# Patient Record
Sex: Male | Born: 1980 | Race: White | Hispanic: No | Marital: Married | Smoking: Current every day smoker
Health system: Southern US, Community
[De-identification: ages and names within clinical notes are randomized; demographics above are authoritative.]

## PROBLEM LIST (undated history)

## (undated) DIAGNOSIS — J45909 Unspecified asthma, uncomplicated: Secondary | ICD-10-CM

## (undated) HISTORY — PX: WISDOM TOOTH EXTRACTION: SHX21

---

## 2014-12-06 ENCOUNTER — Emergency Department (HOSPITAL_COMMUNITY)
Admission: EM | Admit: 2014-12-06 | Discharge: 2014-12-07 | Disposition: A | Discharge status: Home / Self Care | Payer: 59 | Attending: Emergency Medicine | Admitting: Emergency Medicine

## 2014-12-06 ENCOUNTER — Encounter (HOSPITAL_COMMUNITY): Payer: Self-pay

## 2014-12-06 DIAGNOSIS — Z720 Tobacco use: Secondary | ICD-10-CM | POA: Insufficient documentation

## 2014-12-06 DIAGNOSIS — R599 Enlarged lymph nodes, unspecified: Secondary | ICD-10-CM | POA: Diagnosis not present

## 2014-12-06 DIAGNOSIS — J45909 Unspecified asthma, uncomplicated: Secondary | ICD-10-CM | POA: Insufficient documentation

## 2014-12-06 DIAGNOSIS — Z23 Encounter for immunization: Secondary | ICD-10-CM | POA: Insufficient documentation

## 2014-12-06 DIAGNOSIS — L0201 Cutaneous abscess of face: Secondary | ICD-10-CM

## 2014-12-06 HISTORY — DX: Unspecified asthma, uncomplicated: J45.909

## 2014-12-06 LAB — I-STAT CREATININE, ED: Creatinine, Ser: 1.3 mg/dL — ABNORMAL HIGH (ref 0.61–1.24)

## 2014-12-06 MED ORDER — KETOROLAC TROMETHAMINE 30 MG/ML IJ SOLN
30.0000 mg | Freq: Once | INTRAMUSCULAR | Status: AC
  Administered 2014-12-06: 30 mg via INTRAVENOUS
  Filled 2014-12-06: qty 1

## 2014-12-06 MED ORDER — TETANUS-DIPHTH-ACELL PERTUSSIS 5-2.5-18.5 LF-MCG/0.5 IM SUSP
0.5000 mL | Freq: Once | INTRAMUSCULAR | Status: AC
  Administered 2014-12-06: 0.5 mL via INTRAMUSCULAR
  Filled 2014-12-06: qty 0.5

## 2014-12-06 MED ORDER — ONDANSETRON HCL 4 MG/2ML IJ SOLN
4.0000 mg | Freq: Once | INTRAMUSCULAR | Status: AC
  Administered 2014-12-06: 4 mg via INTRAVENOUS
  Filled 2014-12-06: qty 2

## 2014-12-06 MED ORDER — DOXYCYCLINE HYCLATE 100 MG PO CAPS: 100.0000 mg | ORAL_CAPSULE | Freq: Two times a day (BID) | ORAL | Status: AC

## 2014-12-06 MED ORDER — DOXYCYCLINE HYCLATE 100 MG PO TABS
100.0000 mg | ORAL_TABLET | Freq: Once | ORAL | Status: AC
  Administered 2014-12-07: 100 mg via ORAL
  Filled 2014-12-06: qty 1

## 2014-12-06 MED ORDER — LIDOCAINE-EPINEPHRINE (PF) 2 %-1:200000 IJ SOLN
10.0000 mL | Freq: Once | INTRAMUSCULAR | Status: AC
  Administered 2014-12-06: 10 mL via INTRADERMAL
  Filled 2014-12-06: qty 20

## 2014-12-06 MED ORDER — HYDROCODONE-ACETAMINOPHEN 5-325 MG PO TABS: 1.0000 | ORAL_TABLET | Freq: Four times a day (QID) | ORAL | Status: AC | PRN

## 2014-12-06 NOTE — ED Notes (Signed)
Pt presents with abscess to lower left chin that he noticed today with occasional purulent drainage but no drainage currently. Denies vomiting/chills.

## 2014-12-06 NOTE — ED Notes (Signed)
Attempted access x2, unsuccessful. Second RN to attempt

## 2014-12-06 NOTE — ED Notes (Signed)
PA at bedside.

## 2014-12-06 NOTE — ED Provider Notes (Signed)
CSN: [id number]     Arrival date & time 12/06/14  2130 History   First MD Initiated Contact with Patient 12/06/14 2213     Chief Complaint  Patient presents with  . Facial Swelling  . Abscess     (Consider location/radiation/quality/duration/timing/severity/associated sxs/prior Treatment) HPI   34 year old male who presents for evaluation of facial infection. Patient notice a boil to his left chin last night that has been draining small amount of pus. Report sharp, moderate pain, worse with palpation.  No fever, chills. He did attempt to poke it with a needle and expressed some pus.  Does not recall last tetanus status.  Denies dental pain.  No neck pain, or sore throat.  No recent injury.    Past Medical History  Diagnosis Date  . Asthma    Past Surgical History  Procedure Laterality Date  . Wisdom tooth extraction     No family history on file. Social History  Substance Use Topics  . Smoking status: Current Every Day Smoker  . Smokeless tobacco: None  . Alcohol Use: Yes    Review of Systems  Constitutional: Negative for fever.  HENT: Positive for facial swelling.   Musculoskeletal: Negative for neck pain.  Skin: Positive for rash.  Neurological: Negative for numbness.      Allergies  Mucinex  Home Medications   Prior to Admission medications   Not on File   BP 156/90 mmHg  Pulse 78  Temp(Src) 99 F (37.2 C) (Oral)  Resp 18  Ht  (1.803 m)  Wt 250 lb (113.399 kg)  BMI 34.88 kg/m2  SpO2 99% Physical Exam  Constitutional: He appears well-developed and well-nourished. No distress.  HENT:  Head: Atraumatic.  Face: an area of induration and fluctuance that is TTP noted to L anterior chin tracking down to inferior chin and neck.  No surround skin erythema.  No dental pain.    Eyes: Conjunctivae are normal.  Neck: Neck supple.  No nuchal rigidity  Lymphadenopathy:    He has cervical adenopathy.  Neurological: He is alert.  Psychiatric: He has a  normal mood and affect.  Nursing note and vitals reviewed.   ED Course  Procedures (including critical care time)  Pt with facial cutaneous abscess amenable for I&D.  tdap given.    11:00 PM I&D performed by myself and PA student Harlin Heys, without pustular drainage.  Will obtain maxillofacial CT scan for further evaluation.  Care discussed with PA Ivar Drape who will continue with pt care.    INCISION AND DRAINAGE Performed by: Fayrene Helper Consent: Verbal consent obtained. Risks and benefits: risks, benefits and alternatives were discussed Type: abscess  Body area: L anterior chin  Anesthesia: local infiltration  Incision was made with a scalpel.  Local anesthetic: lidocaine 2% w epinephrine  Anesthetic total: 6 ml  Complexity: complex Blunt dissection to break up loculations  Drainage: sanguianous  Drainage amount: small  Packing material: 1/4 in iodoform gauze  Patient tolerance: Patient tolerated the procedure well with no immediate complications.  11:09 PM Care discussed with Dr. Patria Mane who agrees to monitor pt and f/u on CT scan and will determine further management.  Pt aware of plan.     Labs Review Labs Reviewed - No data to display  Imaging Review No results found. I have personally reviewed and evaluated these images and lab results as part of my medical decision-making.   EKG Interpretation None      MDM   Final diagnoses:  Cutaneous abscess of face    BP 156/90 mmHg  Pulse 78  Temp(Src) 99 F (37.2 C) (Oral)  Resp 18  Ht  (1.803 m)  Wt 250 lb (113.399 kg)  BMI 34.88 kg/m2  SpO2 99%     Fayrene Helper, PA-C 12/07/14 1259  Azalia Bilis, MD 12/08/14 [phone]

## 2014-12-07 ENCOUNTER — Encounter (HOSPITAL_COMMUNITY): Payer: Self-pay | Admitting: Radiology

## 2014-12-07 ENCOUNTER — Emergency Department (HOSPITAL_COMMUNITY): Payer: 59

## 2014-12-07 MED ORDER — IOHEXOL 300 MG/ML  SOLN
75.0000 mL | Freq: Once | INTRAMUSCULAR | Status: AC | PRN
  Administered 2014-12-07: 75 mL via INTRAVENOUS

## 2014-12-07 MED ORDER — PENICILLIN V POTASSIUM 250 MG PO TABS: 250.0000 mg | ORAL_TABLET | Freq: Four times a day (QID) | ORAL | Status: DC

## 2014-12-07 NOTE — Discharge Instructions (Signed)
Apply warm compress to affected area several times daily.  Follow up with your dentist in 2 days. Take antibiotic and pain medication as prescribed.  Return if your condition worsen or if you have other concerns.  Abscess Care After An abscess (also called a boil or furuncle) is an infected area that contains a collection of pus. Signs and symptoms of an abscess include pain, tenderness, redness, or hardness, or you may feel a moveable soft area under your skin. An abscess can occur anywhere in the body. The infection may spread to surrounding tissues causing cellulitis. A cut (incision) by the surgeon was made over your abscess and the pus was drained out. Gauze may have been packed into the space to provide a drain that will allow the cavity to heal from the inside outwards. The boil may be painful for 5 to 7 days. Most people with a boil do not have high fevers. Your abscess, if seen early, may not have localized, and may not have been lanced. If not, another appointment may be required for this if it does not get better on its own or with medications. HOME CARE INSTRUCTIONS   Only take over-the-counter or prescription medicines for pain, discomfort, or fever as directed by your caregiver.  When you bathe, soak and then remove gauze or iodoform packs at least daily or as directed by your caregiver. You may then wash the wound gently with mild soapy water. Repack with gauze or do as your caregiver directs. SEEK IMMEDIATE MEDICAL CARE IF:   You develop increased pain, swelling, redness, drainage, or bleeding in the wound site.  You develop signs of generalized infection including muscle aches, chills, fever, or a general ill feeling.  An oral temperature above 102 F (38.9 C) develops, not controlled by medication. See your caregiver for a recheck if you develop any of the symptoms described above. If medications (antibiotics) were prescribed, take them as directed. Document Released: 09/16/2004  Document Revised: 05/23/2011 Document Reviewed: 05/14/2007 [facility] Patient Information 2015 [place], [place]. This information is not intended to replace advice given to you by your health care provider. Make sure you discuss any questions you have with your health care provider. Dental Abscess A dental abscess is a collection of infected fluid (pus) from a bacterial infection in the inner part of the tooth (pulp). It usually occurs at the end of the tooth's root.  CAUSES   Severe tooth decay.  Trauma to the tooth that allows bacteria to enter into the pulp, such as a broken or chipped tooth. SYMPTOMS   Severe pain in and around the infected tooth.  Swelling and redness around the abscessed tooth or in the mouth or face.  Tenderness.  Pus drainage.  Bad breath.  Bitter taste in the mouth.  Difficulty swallowing.  Difficulty opening the mouth.  Nausea.  Vomiting.  Chills.  Swollen neck glands. DIAGNOSIS   A medical and dental history will be taken.  An examination will be performed by tapping on the abscessed tooth.  X-rays may be taken of the tooth to identify the abscess. TREATMENT The goal of treatment is to eliminate the infection. You may be prescribed antibiotic medicine to stop the infection from spreading. A root canal may be performed to save the tooth. If the tooth cannot be saved, it may be pulled (extracted) and the abscess may be drained.  HOME CARE INSTRUCTIONS  Only take over-the-counter or prescription medicines for pain, fever, or discomfort as directed by your caregiver.  Rinse your mouth (gargle) often with salt water ( tsp salt in 8 oz [250 ml] of warm water) to relieve pain or swelling.  Do not drive after taking pain medicine (narcotics).  Do not apply heat to the outside of your face.  Return to your dentist for further treatment as directed. SEEK MEDICAL CARE IF:  Your pain is not helped by medicine.  Your pain is getting worse instead  of better. SEEK IMMEDIATE MEDICAL CARE IF:  You have a fever or persistent symptoms for more than 2-3 days.  You have a fever and your symptoms suddenly get worse.  You have chills or a very bad headache.  You have problems breathing or swallowing.  You have trouble opening your mouth.  You have swelling in the neck or around the eye. Document Released: 02/28/2005 Document Revised: 11/23/2011 Document Reviewed: 06/08/2010 [facility] Patient Information 2015 [place], [place]. This information is not intended to replace advice given to you by your health care provider. Make sure you discuss any questions you have with your health care provider.

## 2014-12-07 NOTE — ED Notes (Signed)
Patient left at this time with all belongings. 

## 2014-12-10 ENCOUNTER — Encounter (HOSPITAL_COMMUNITY): Payer: Self-pay | Admitting: *Deleted

## 2014-12-10 ENCOUNTER — Emergency Department (HOSPITAL_COMMUNITY)
Admission: EM | Admit: 2014-12-10 | Discharge: 2014-12-10 | Disposition: A | Discharge status: Home / Self Care | Payer: 59 | Attending: Emergency Medicine | Admitting: Emergency Medicine

## 2014-12-10 DIAGNOSIS — Z79899 Other long term (current) drug therapy: Secondary | ICD-10-CM | POA: Insufficient documentation

## 2014-12-10 DIAGNOSIS — L03211 Cellulitis of face: Secondary | ICD-10-CM | POA: Diagnosis not present

## 2014-12-10 DIAGNOSIS — L0201 Cutaneous abscess of face: Secondary | ICD-10-CM | POA: Diagnosis present

## 2014-12-10 DIAGNOSIS — Z720 Tobacco use: Secondary | ICD-10-CM | POA: Diagnosis not present

## 2014-12-10 LAB — BASIC METABOLIC PANEL
Anion gap: 6 (ref 5–15)
BUN: 13 mg/dL (ref 6–20)
CO2: 27 mmol/L (ref 22–32)
CREATININE: 1.03 mg/dL (ref 0.61–1.24)
Calcium: 9 mg/dL (ref 8.9–10.3)
Chloride: 108 mmol/L (ref 101–111)
GFR calc Af Amer: >60 mL/min (ref >60)
Glucose, Bld: 96 mg/dL (ref 65–99)
Potassium: 4.5 mmol/L (ref 3.5–5.1)
SODIUM: 141 mmol/L (ref 135–145)

## 2014-12-10 LAB — CBC WITH DIFFERENTIAL/PLATELET
BASOS ABS: 0.1 10*3/uL (ref 0.0–0.1)
Basophils Relative: 1 %
EOS ABS: 0.5 10*3/uL (ref 0.0–0.7)
EOS PCT: 6 %
HCT: 44.7 % (ref 39.0–52.0)
Hemoglobin: 15 g/dL (ref 13.0–17.0)
Lymphocytes Relative: 23 %
Lymphs Abs: 1.8 10*3/uL (ref 0.7–4.0)
MCH: 30.3 pg (ref 26.0–34.0)
MCHC: 33.6 g/dL (ref 30.0–36.0)
MCV: 90.3 fL (ref 78.0–100.0)
Monocytes Absolute: 0.5 10*3/uL (ref 0.1–1.0)
Monocytes Relative: 6 %
Neutro Abs: 5 10*3/uL (ref 1.7–7.7)
Neutrophils Relative %: 64 %
PLATELETS: 329 10*3/uL (ref 150–400)
RBC: 4.95 MIL/uL (ref 4.22–5.81)
RDW: 13.9 % (ref 11.5–15.5)
WBC: 7.8 10*3/uL (ref 4.0–10.5)

## 2014-12-10 LAB — I-STAT CG4 LACTIC ACID, ED: LACTIC ACID, VENOUS: 0.79 mmol/L (ref 0.5–2.0)

## 2014-12-10 MED ORDER — SULFAMETHOXAZOLE-TRIMETHOPRIM 800-160 MG PO TABS
1.0000 | ORAL_TABLET | Freq: Once | ORAL | Status: AC
  Administered 2014-12-10: 1 via ORAL
  Filled 2014-12-10: qty 1

## 2014-12-10 MED ORDER — SULFAMETHOXAZOLE-TRIMETHOPRIM 800-160 MG PO TABS: 1.0000 | ORAL_TABLET | Freq: Two times a day (BID) | ORAL | Status: AC

## 2014-12-10 NOTE — Discharge Instructions (Signed)
As discussed, facial cellulitis can take up to one week to resolve. Please take all medication as directed, use warm compresses, 4 times daily, and monitor your condition carefully. Return here for concerning changes in your condition.  If you develop concerning changes, but no difficult to breathing, no fever, you may also choose to follow-up with our ENT specialist.   Cellulitis Cellulitis is an infection of the skin and the tissue under the skin. The infected area is usually red and tender. This happens most often in the arms and lower legs. HOME CARE   Take your antibiotic medicine as told. Finish the medicine even if you start to feel better.  Keep the infected arm or leg raised (elevated).  Put a warm cloth on the area up to 4 times per day.  Only take medicines as told by your doctor.  Keep all doctor visits as told. GET HELP IF:  You see red streaks on the skin coming from the infected area.  Your red area gets bigger or turns a dark color.  Your bone or joint under the infected area is painful after the skin heals.  Your infection comes back in the same area or different area.  You have a puffy (swollen) bump in the infected area.  You have new symptoms.  You have a fever. GET HELP RIGHT AWAY IF:   You feel very sleepy.  You throw up (vomit) or have watery poop (diarrhea).  You feel sick and have muscle aches and pains. MAKE SURE YOU:   Understand these instructions.  Will watch your condition.  Will get help right away if you are not doing well or get worse. Document Released: 08/17/2007 Document Revised: 07/15/2013 Document Reviewed: 05/16/2011 [facility] Patient Information 2015 [place], [place]. This information is not intended to replace advice given to you by your health care provider. Make sure you discuss any questions you have with your health care provider.

## 2014-12-10 NOTE — ED Notes (Signed)
Pt reports being seen on 9/24 for dental abscess, started on antibiotics and no relief, having increase in swelling down his neck. Airway intact.

## 2014-12-10 NOTE — ED Provider Notes (Addendum)
CSN: [id number]     Arrival date & time 12/10/14  1040 History   First MD Initiated Contact with Patient 12/10/14 1234     Chief Complaint  Patient presents with  . Abscess     (Consider location/radiation/quality/duration/timing/severity/associated sxs/prior Treatment) HPI Patient presents with concern of facial wound. Wound began last week, since onset he has been seen by another practitioner, started on antibiotics. He notes that in spite of initiating antibiotics, he continues to have large erythematous lesion on the left inferior lateral face. No new difficulty swallowing, breathing, speaking. No new fever, chills, vomiting, diarrhea. There is some discomfort diffusely throughout the lesion, unchanged with OTC medication. Past Medical History  Diagnosis Date  . Asthma    Past Surgical History  Procedure Laterality Date  . Wisdom tooth extraction     History reviewed. No pertinent family history. Social History  Substance Use Topics  . Smoking status: Current Every Day Smoker  . Smokeless tobacco: None  . Alcohol Use: Yes    Review of Systems  Constitutional:       Per HPI, otherwise negative  HENT:       Per HPI, otherwise negative  Respiratory:       Per HPI, otherwise negative  Cardiovascular:       Per HPI, otherwise negative  Gastrointestinal: Negative for nausea and vomiting.  Endocrine:       Negative aside from HPI  Genitourinary:       Neg aside from HPI   Musculoskeletal:       Per HPI, otherwise negative  Skin: Positive for color change and wound.  Neurological: Negative for syncope, weakness and headaches.      Allergies  Mucinex  Home Medications   Prior to Admission medications   Medication Sig Start Date End Date Taking? Authorizing Provider  doxycycline (VIBRAMYCIN) 100 MG capsule Take 1 capsule (100 mg total) by mouth 2 (two) times daily. One po bid x 7 days 12/06/14  Yes Fayrene Helper, PA-C  esomeprazole (NEXIUM) 40 MG capsule Take 40  mg by mouth daily at 12 noon.   Yes Historical Provider, MD  HYDROcodone-acetaminophen (NORCO/VICODIN) 5-325 MG per tablet Take 1 tablet by mouth every 6 (six) hours as needed for moderate pain or severe pain. 12/06/14  Yes Fayrene Helper, PA-C  penicillin v potassium (VEETID) 250 MG tablet Take 1 tablet (250 mg total) by mouth 4 (four) times daily. 12/07/14 12/14/14 Yes Roxy Horseman, PA-C   BP 129/96 mmHg  Pulse 95  Temp(Src) 99.4 F (37.4 C) (Oral)  Resp 17  Ht  (1.803 m)  Wt 250 lb (113.399 kg)  BMI 34.88 kg/m2  SpO2 97% Physical Exam  Constitutional: He is oriented to person, place, and time. He appears well-developed and well-nourished. No distress.  HENT:  Head: Normocephalic and atraumatic.  Mouth/Throat: Uvula is midline, oropharynx is clear and moist and mucous membranes are normal.  Face: an area of induration and fluctuance that is TTP noted to L anterior chin tracking down to inferior chin and neck.  No surround skin erythema.  The area of induration is from the left lateral chin tracking inferiorly, approximately 10 cm total diameter  Eyes: Conjunctivae and EOM are normal.  Neck: Neck supple.  No nuchal rigidity  Cardiovascular: Normal rate and regular rhythm.   Pulmonary/Chest: Effort normal. No stridor. No respiratory distress.  Abdominal: He exhibits no distension.  Musculoskeletal: He exhibits no edema.  Lymphadenopathy:    He has cervical adenopathy.  Neurological: He is alert and oriented to person, place, and time.  Skin: Skin is warm and dry.  Psychiatric: He has a normal mood and affect.  Nursing note and vitals reviewed.   ED Course  Procedures (including critical care time) Labs Review Labs Reviewed  BASIC METABOLIC PANEL  CBC WITH DIFFERENTIAL/PLATELET  I-STAT CG4 LACTIC ACID, ED    I reviewed the patient's chart, including visit here several days ago, with CT scan, ultrasound, concerning for facial cellulitis, no evidence for abscess.  On  repeat exam, patient has no new complaints Discussed all findings, he for close monitoring, burning in his antibiotics coverage to include MRSA. Patient will follow up with ENT as needed. MDM  Patient presents with concern of wound progression after recent diagnosis of facial cellulitis. Here, patient does have indurated area on his left anterior face, but no evidence for oral pharyngeal spread, nor evidence for bacteremia or sepsis. Patient's labs are reassuring, vital signs reassuring. Patient did have slight change in antibiotics, but was appropriate for further monitoring, management at home.    Gerhard Munch, MD 12/10/14 1415  Gerhard Munch, MD 01/22/15 [phone]

## 2017-05-15 IMAGING — CT CT MAXILLOFACIAL W/ CM
3 series · 15 of 47 positions shown, 18 images · IV contrast (omnipaque)
Comparison: None.

CLINICAL DATA: Left facial swelling. Left chin abscess since
yesterday. The patient drained it himself.

EXAM:
CT MAXILLOFACIAL WITH CONTRAST
TECHNIQUE: Multidetector CT imaging of the maxillofacial structures was
performed with intravenous contrast. Multiplanar CT image
reconstructions were also generated. A small metallic BB was placed
on the right temple in order to reliably differentiate right from
left.
CONTRAST:  75mL OMNIPAQUE IOHEXOL 300 MG/ML  SOLN

[Series 3: facial/ orbits 2.0 h30s · axial · 0.37mm/px · z∈[-198,-16]mm · 9 of 107 slices shown, 12 images]
[im 8/107  brain]
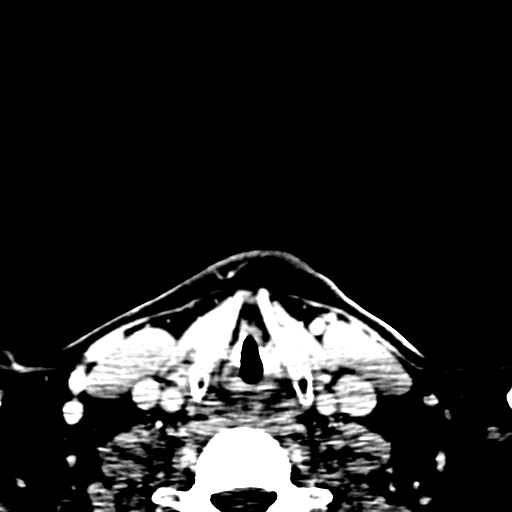
[im 8/107  bone]
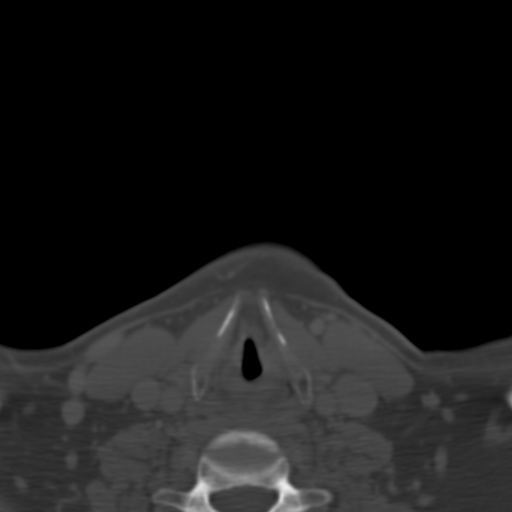
[im 19/107  bone]
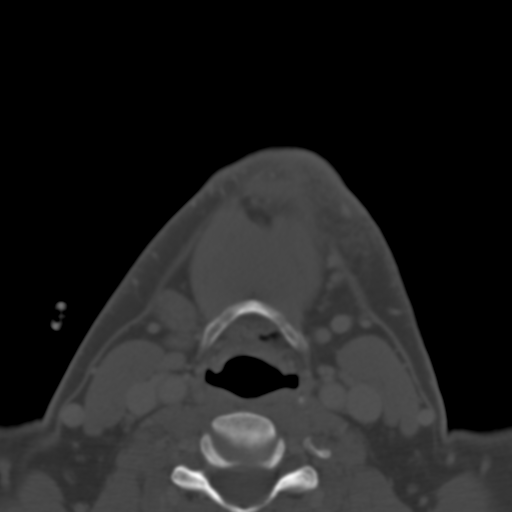
[im 30/107  bone]
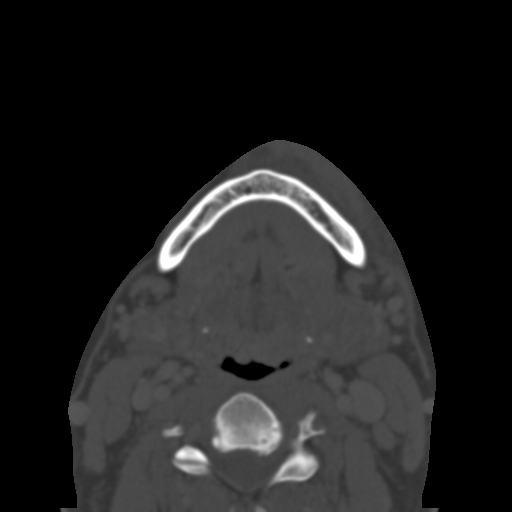
[im 41/107  bone]
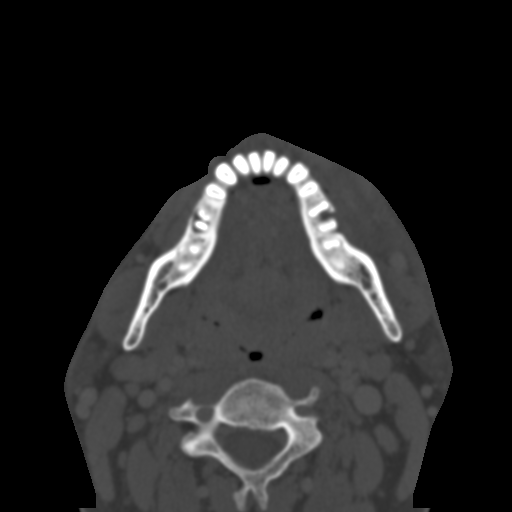
[im 55/107  brain]
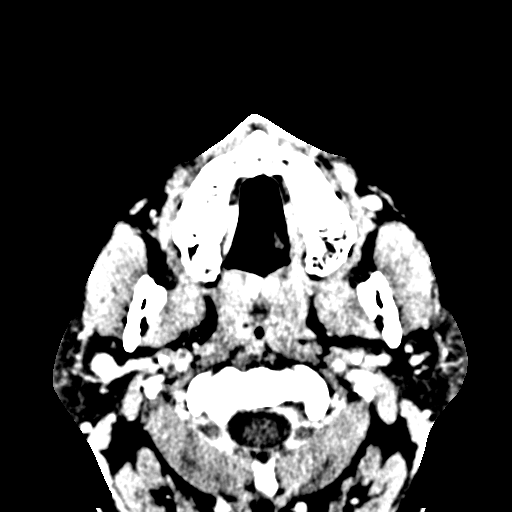
[im 55/107  bone]
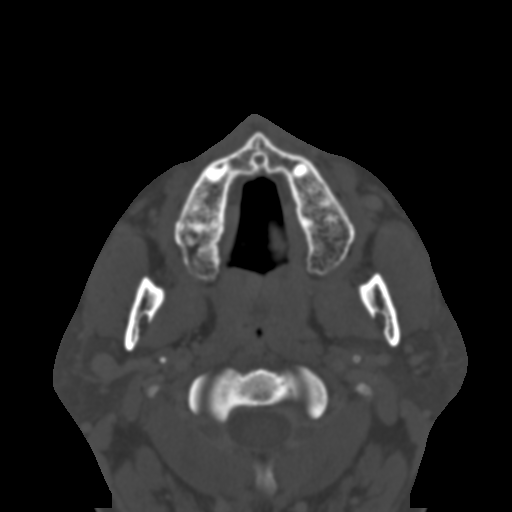
[im 66/107  bone]
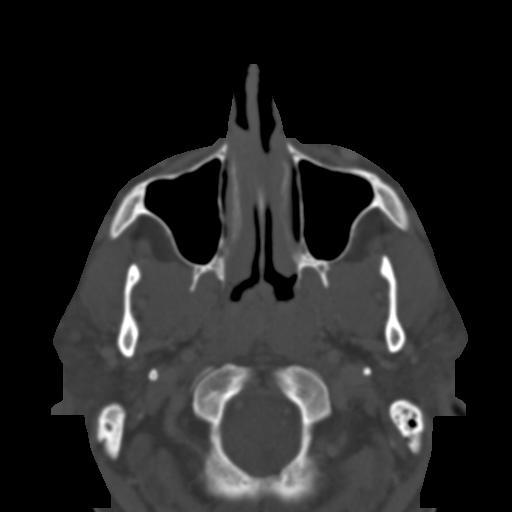
[im 77/107  bone]
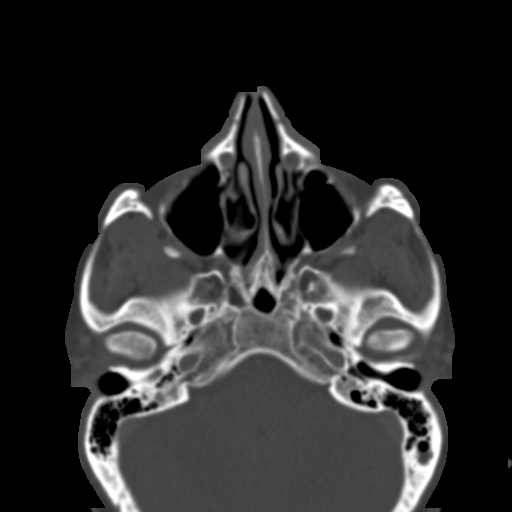
[im 88/107  bone]
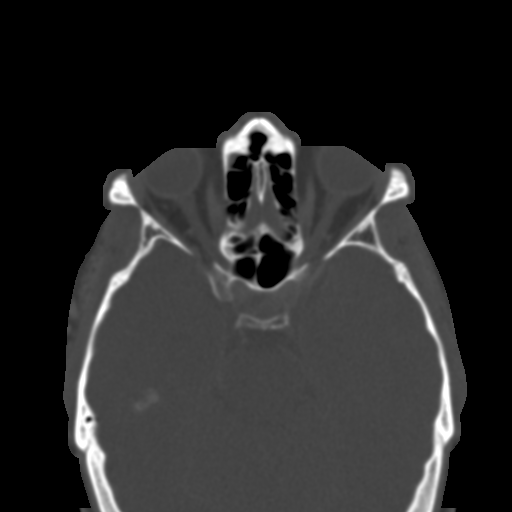
[im 99/107  brain]
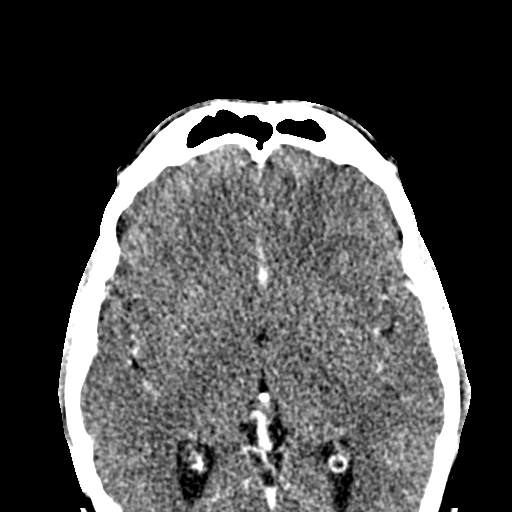
[im 99/107  bone]
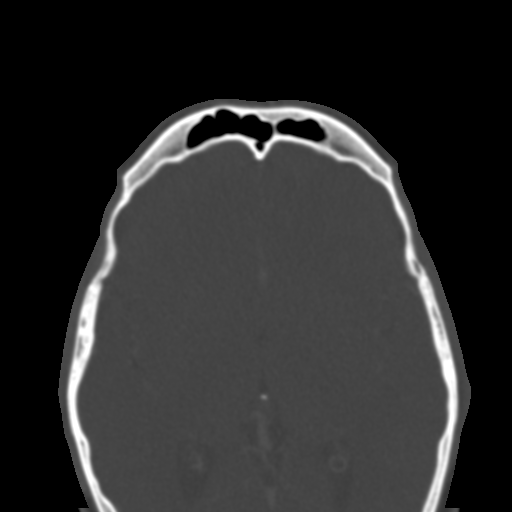

[Series 7: coronal soft tissue · coronal · 0.38mm/px · 3 of 88 slices shown]
[im 30/88  bone]
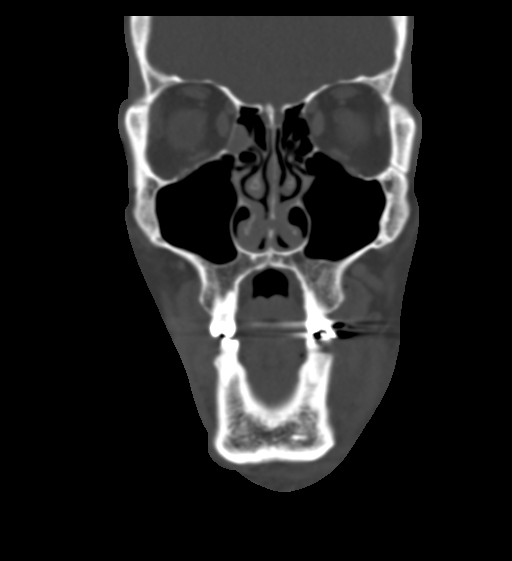
[im 39/88  bone]
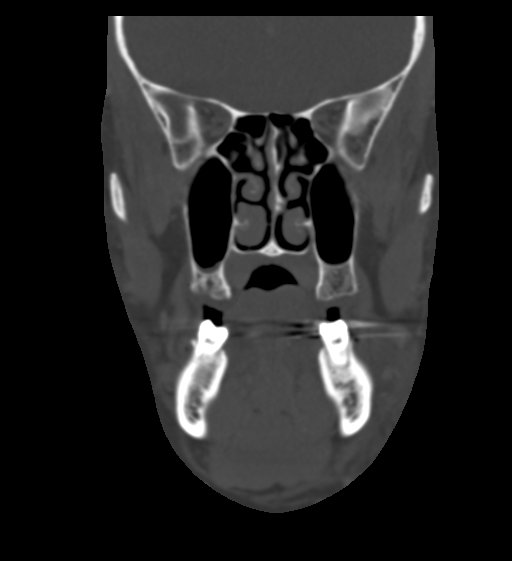
[im 49/88  bone]
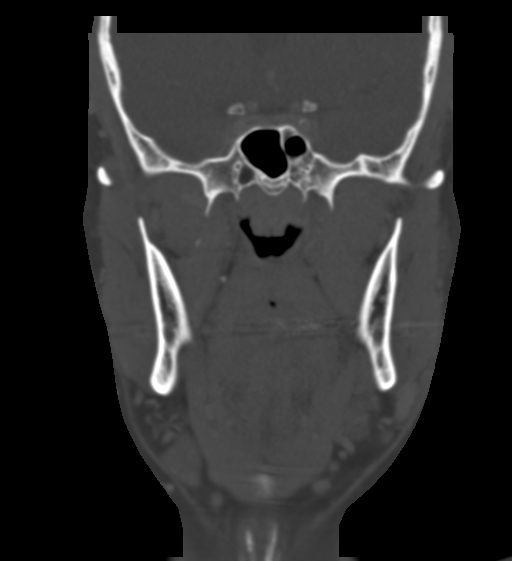

[Series 8: sagittal soft tissue · sagittal · 0.35mm/px · 3 of 88 slices shown]
[im 30/88  bone]
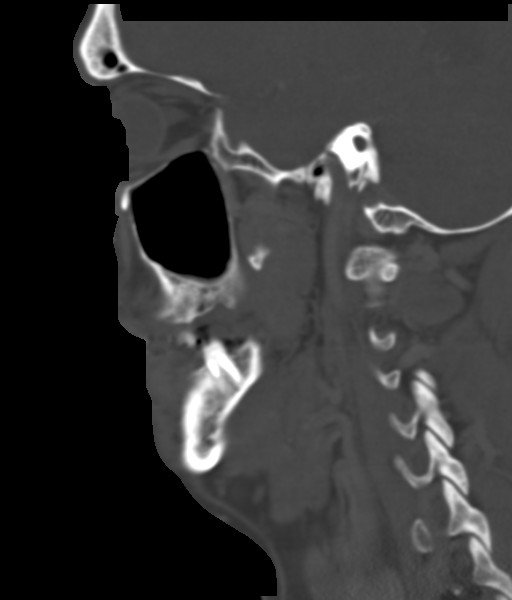
[im 44/88  bone]
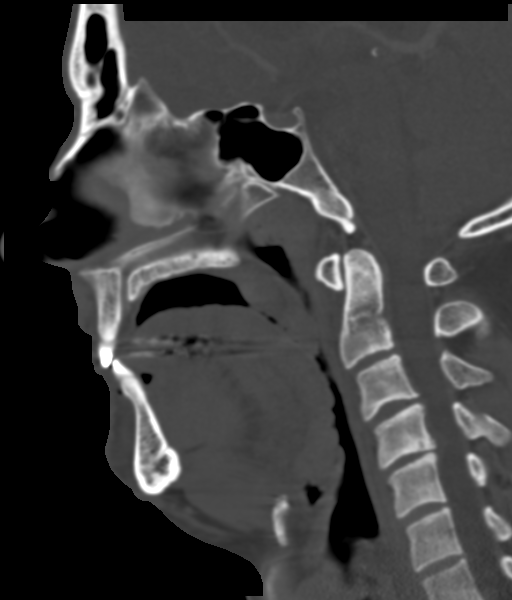
[im 59/88  bone]
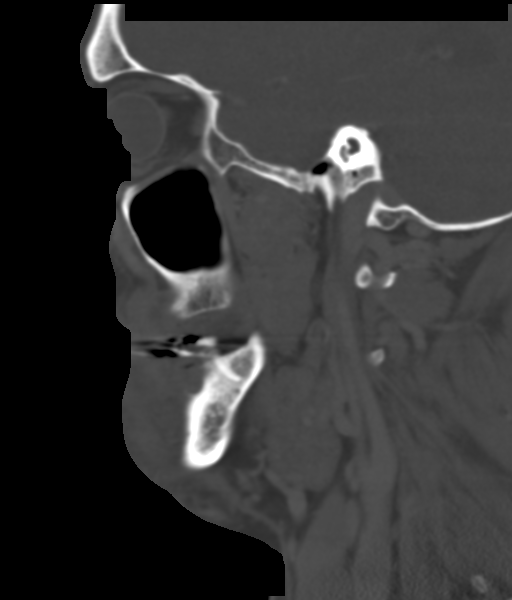

[15 of 47 positions shown; findings below may reference images not displayed]

FINDINGS: Subcutaneous edema anterior and inferior to the mandible on the left
and in the midline. No fluid collection seen. Small amount of
anterior soft tissue gas, compatible with the history recent self
drainage. The underlying bones appear normal. Cavities in multiple
teeth bilaterally. There is also a periapical lucency involving the
left first molar, containing a cavity. This is in the region of soft
tissue edema.

Mild degenerative changes in the upper cervical spine. Mildly
enlarged cervical lymph nodes bilaterally. These include a lymph
node anterior to the right sternocleidomastoid mild old muscle and
at the post fat aspect to the right mandibular angle, with a short
axis diameter of 11 mm on image number 39. A similarly positioned
lymph node on the left has a short axis diameter of 8 mm on image
number 41.
IMPRESSION: 1. Multiple bilateral dental cavities. These include and a cavity in
the left lower first molar with a periapical abscess that has
probably rotated into the overlying subcutaneous fat, causing
cellulitis. No soft tissue abscess is visible today.
2. Mild bilateral reactive cervical adenopathy.
